# Patient Record
Sex: Female | Born: 2000 | Race: White | Hispanic: No | Smoking: Never smoker
Health system: Southern US, Community
[De-identification: ages and names within clinical notes are randomized; demographics above are authoritative.]

## PROBLEM LIST (undated history)

## (undated) HISTORY — PX: NO PAST SURGERIES: SHX2092

---

## 2020-04-04 ENCOUNTER — Inpatient Hospital Stay (HOSPITAL_COMMUNITY)
Admission: EM | Admit: 2020-04-04 | Discharge: 2020-04-06 | DRG: 519 | Disposition: A | Discharge status: Home / Self Care | Payer: BC Managed Care – PPO | Attending: Orthopedic Surgery | Admitting: Orthopedic Surgery

## 2020-04-04 ENCOUNTER — Emergency Department (HOSPITAL_COMMUNITY): Payer: BC Managed Care – PPO

## 2020-04-04 ENCOUNTER — Other Ambulatory Visit: Payer: Self-pay

## 2020-04-04 ENCOUNTER — Encounter (HOSPITAL_COMMUNITY): Payer: Self-pay

## 2020-04-04 DIAGNOSIS — S329XXA Fracture of unspecified parts of lumbosacral spine and pelvis, initial encounter for closed fracture: Secondary | ICD-10-CM | POA: Diagnosis present

## 2020-04-04 DIAGNOSIS — S32811A Multiple fractures of pelvis with unstable disruption of pelvic ring, initial encounter for closed fracture: Secondary | ICD-10-CM | POA: Diagnosis not present

## 2020-04-04 DIAGNOSIS — Y92410 Unspecified street and highway as the place of occurrence of the external cause: Secondary | ICD-10-CM

## 2020-04-04 DIAGNOSIS — Z23 Encounter for immunization: Secondary | ICD-10-CM

## 2020-04-04 DIAGNOSIS — S32810A Multiple fractures of pelvis with stable disruption of pelvic ring, initial encounter for closed fracture: Secondary | ICD-10-CM | POA: Diagnosis present

## 2020-04-04 DIAGNOSIS — S0181XA Laceration without foreign body of other part of head, initial encounter: Secondary | ICD-10-CM

## 2020-04-04 DIAGNOSIS — Z419 Encounter for procedure for purposes other than remedying health state, unspecified: Secondary | ICD-10-CM

## 2020-04-04 DIAGNOSIS — Z20822 Contact with and (suspected) exposure to covid-19: Secondary | ICD-10-CM | POA: Diagnosis present

## 2020-04-04 DIAGNOSIS — D62 Acute posthemorrhagic anemia: Secondary | ICD-10-CM | POA: Diagnosis present

## 2020-04-04 DIAGNOSIS — S3210XA Unspecified fracture of sacrum, initial encounter for closed fracture: Secondary | ICD-10-CM | POA: Diagnosis present

## 2020-04-04 DIAGNOSIS — S32592A Other specified fracture of left pubis, initial encounter for closed fracture: Secondary | ICD-10-CM

## 2020-04-04 LAB — BASIC METABOLIC PANEL
Anion gap: 9 (ref 5–15)
BUN: 22 mg/dL — ABNORMAL HIGH (ref 6–20)
CO2: 24 mmol/L (ref 22–32)
Calcium: 9.5 mg/dL (ref 8.9–10.3)
Chloride: 104 mmol/L (ref 98–111)
Creatinine, Ser: 0.84 mg/dL (ref 0.44–1.00)
GFR calc Af Amer: >60 mL/min (ref >60)
GFR calc non Af Amer: >60 mL/min (ref >60)
Glucose, Bld: 123 mg/dL — ABNORMAL HIGH (ref 70–99)
Potassium: 3.8 mmol/L (ref 3.5–5.1)
Sodium: 137 mmol/L (ref 135–145)

## 2020-04-04 LAB — I-STAT CHEM 8, ED
BUN: 25 mg/dL — ABNORMAL HIGH (ref 6–20)
Calcium, Ion: 1.28 mmol/L (ref 1.15–1.40)
Chloride: 103 mmol/L (ref 98–111)
Creatinine, Ser: 0.8 mg/dL (ref 0.44–1.00)
Glucose, Bld: 119 mg/dL — ABNORMAL HIGH (ref 70–99)
HCT: 38 % (ref 36.0–46.0)
Hemoglobin: 12.9 g/dL (ref 12.0–15.0)
Potassium: 3.7 mmol/L (ref 3.5–5.1)
Sodium: 140 mmol/L (ref 135–145)
TCO2: 27 mmol/L (ref 22–32)

## 2020-04-04 LAB — CBC
HCT: 38.6 % (ref 36.0–46.0)
Hemoglobin: 12.8 g/dL (ref 12.0–15.0)
MCH: 28.7 pg (ref 26.0–34.0)
MCHC: 33.2 g/dL (ref 30.0–36.0)
MCV: 86.5 fL (ref 80.0–100.0)
Platelets: 299 10*3/uL (ref 150–400)
RBC: 4.46 MIL/uL (ref 3.87–5.11)
RDW: 12.3 % (ref 11.5–15.5)
WBC: 16.6 10*3/uL — ABNORMAL HIGH (ref 4.0–10.5)
nRBC: 0 % (ref 0.0–0.2)

## 2020-04-04 LAB — I-STAT BETA HCG BLOOD, ED (MC, WL, AP ONLY): I-stat hCG, quantitative: <5 m[IU]/mL (ref <5)

## 2020-04-04 MED ORDER — LIDOCAINE HCL (PF) 1 % IJ SOLN
30.0000 mL | Freq: Once | INTRAMUSCULAR | Status: AC
  Administered 2020-04-04: 30 mL
  Filled 2020-04-04: qty 30

## 2020-04-04 MED ORDER — FENTANYL CITRATE (PF) 100 MCG/2ML IJ SOLN
50.0000 ug | Freq: Once | INTRAMUSCULAR | Status: AC
  Administered 2020-04-04: 50 ug via INTRAVENOUS
  Filled 2020-04-04: qty 2

## 2020-04-04 MED ORDER — LIDOCAINE HCL 2 % IJ SOLN
5.0000 mL | Freq: Once | INTRAMUSCULAR | Status: AC
  Administered 2020-04-05: 100 mg
  Filled 2020-04-04: qty 20

## 2020-04-04 MED ORDER — IOHEXOL 300 MG/ML  SOLN
100.0000 mL | Freq: Once | INTRAMUSCULAR | Status: AC | PRN
  Administered 2020-04-04: 100 mL via INTRAVENOUS

## 2020-04-04 MED ORDER — TETANUS-DIPHTH-ACELL PERTUSSIS 5-2.5-18.5 LF-MCG/0.5 IM SUSP
0.5000 mL | Freq: Once | INTRAMUSCULAR | Status: AC
  Administered 2020-04-04: 0.5 mL via INTRAMUSCULAR
  Filled 2020-04-04: qty 0.5
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
## 2020-04-04 NOTE — Discharge Instructions (Addendum)
Orthopaedic Trauma Service Discharge Instructions   General Discharge Instructions  Orthopaedic Injuries:  Pelvic ring fracture treated with sacroiliac screws  WEIGHT BEARING STATUS: Touchdown weightbearing left leg, weight-bear as tolerated right leg.  Use walker or crutches  RANGE OF MOTION/ACTIVITY: Unrestricted range of motion hips and knees.  Activity as tolerated while maintaining weightbearing restrictions above  Wound Care: Daily wound care starting on 04/09/2020.  See below   Discharge Wound Care Instructions  Do NOT apply any ointments, solutions or lotions to pin sites or surgical wounds.  These prevent needed drainage and even though solutions like hydrogen peroxide kill bacteria, they also damage cells lining the pin sites that help fight infection.  Applying lotions or ointments can keep the wounds moist and can cause them to breakdown and open up as well. This can increase the risk for infection. When in doubt call the office.  Surgical incisions should be dressed daily Starting on 04/09/2020  If any drainage is noted, use gauze and tape  Once the incision is completely dry and without drainage, it may be left open to air out.  Showering may begin 36-48 hours later.  Cleaning gently with soap and water.  DVT/PE prophylaxis: Eliquis 2.5 mg every 12 hours x 4 weeks  Diet: as you were eating previously.  Can use over the counter stool softeners and bowel preparations, such as Miralax, to help with bowel movements.  Narcotics can be constipating.  Be sure to drink plenty of fluids  PAIN MEDICATION USE AND EXPECTATIONS  You have likely been given narcotic medications to help control your pain.  After a traumatic event that results in an fracture (broken bone) with or without surgery, it is ok to use narcotic pain medications to help control one's pain.  We understand that everyone responds to pain differently and each individual patient will be evaluated on a regular basis  for the continued need for narcotic medications. Ideally, narcotic medication use should last no more than 6-8 weeks (coinciding with fracture healing).   As a patient it is your responsibility as well to monitor narcotic medication use and report the amount and frequency you use these medications when you come to your office visit.   We would also advise that if you are using narcotic medications, you should take a dose prior to therapy to maximize you participation.  IF YOU ARE ON NARCOTIC MEDICATIONS IT IS NOT PERMISSIBLE TO OPERATE A MOTOR VEHICLE (MOTORCYCLE/CAR/TRUCK/MOPED) OR HEAVY MACHINERY DO NOT MIX NARCOTICS WITH OTHER CNS (CENTRAL NERVOUS SYSTEM) DEPRESSANTS SUCH AS ALCOHOL   STOP SMOKING OR USING NICOTINE PRODUCTS!!!!  As discussed nicotine severely impairs your body's ability to heal surgical and traumatic wounds but also impairs bone healing.  Wounds and bone heal by forming microscopic blood vessels (angiogenesis) and nicotine is a vasoconstrictor (essentially, shrinks blood vessels).  Therefore, if vasoconstriction occurs to these microscopic blood vessels they essentially disappear and are unable to deliver necessary nutrients to the healing tissue.  This is one modifiable factor that you can do to dramatically increase your chances of healing your injury.    (This means no smoking, no nicotine gum, patches, etc)  DO NOT USE NONSTEROIDAL ANTI-INFLAMMATORY DRUGS (NSAID'S)  Using products such as Advil (ibuprofen), Aleve (naproxen), Motrin (ibuprofen) for additional pain control during fracture healing can delay and/or prevent the healing response.  If you would like to take over the counter (OTC) medication, Tylenol (acetaminophen) is ok.  However, some narcotic medications that are given for  pain control contain acetaminophen as well. Therefore, you should not exceed more than 4000 mg of tylenol in a day if you do not have liver disease.  Also note that there are may OTC medicines,  such as cold medicines and allergy medicines that my contain tylenol as well.  If you have any questions about medications and/or interactions please ask your doctor/PA or your pharmacist.      ICE AND ELEVATE INJURED/OPERATIVE EXTREMITY  Using ice and elevating the injured extremity above your heart can help with swelling and pain control.  Icing in a pulsatile fashion, such as 20 minutes on and 20 minutes off, can be followed.    Do not place ice directly on skin. Make sure there is a barrier between to skin and the ice pack.    Using frozen items such as frozen peas works well as the conform nicely to the are that needs to be iced.  USE AN ACE WRAP OR TED HOSE FOR SWELLING CONTROL  In addition to icing and elevation, Ace wraps or TED hose are used to help limit and resolve swelling.  It is recommended to use Ace wraps or TED hose until you are informed to stop.    When using Ace Wraps start the wrapping distally (farthest away from the body) and wrap proximally (closer to the body)   Example: If you had surgery on your leg or thing and you do not have a splint on, start the ace wrap at the toes and work your way up to the thigh        If you had surgery on your upper extremity and do not have a splint on, start the ace wrap at your fingers and work your way up to the upper arm  IF YOU ARE IN A SPLINT OR CAST DO NOT REMOVE IT FOR ANY REASON   If your splint gets wet for any reason please contact the office immediately. You may shower in your splint or cast as long as you keep it dry.  This can be done by wrapping in a cast cover or garbage back (or similar)  Do Not stick any thing down your splint or cast such as pencils, money, or hangers to try and scratch yourself with.  If you feel itchy take benadryl as prescribed on the bottle for itching  IF YOU ARE IN A CAM BOOT (BLACK BOOT)  You may remove boot periodically. Perform daily dressing changes as noted below.  Wash the liner of the boot  regularly and wear a sock when wearing the boot. It is recommended that you sleep in the boot until told otherwise    Call office for the following:  Temperature greater than 101F  Persistent nausea and vomiting  Severe uncontrolled pain  Redness, tenderness, or signs of infection (pain, swelling, redness, odor or green/yellow discharge around the site)  Difficulty breathing, headache or visual disturbances  Hives  Persistent dizziness or light-headedness  Extreme fatigue  Any other questions or concerns you may have after discharge  In an emergency, call 911 or go to an Emergency Department at a nearby hospital  HELPFUL INFORMATION  ? If you had a block, it will wear off between 8-24 hrs postop typically.  This is period when your pain may go from nearly zero to the pain you would have had postop without the block.  This is an abrupt transition but nothing dangerous is happening.  You may take an extra dose of narcotic  when this happens.  ? You should wean off your narcotic medicines as soon as you are able.  Most patients will be off or using minimal narcotics before their first postop appointment.   ? We suggest you use the pain medication the first night prior to going to bed, in order to ease any pain when the anesthesia wears off. You should avoid taking pain medications on an empty stomach as it will make you nauseous.  ? Do not drink alcoholic beverages or take illicit drugs when taking pain medications.  ? In most states it is against the law to drive while you are in a splint or sling.  And certainly against the law to drive while taking narcotics.  ? You may return to work/school in the next couple of days when you feel up to it.   ? Pain medication may make you constipated.  Below are a few solutions to try in this order: - Decrease the amount of pain medication if you aren't having pain. - Drink lots of decaffeinated fluids. - Drink prune juice and/or each  dried prunes  o If the first 3 don't work start with additional solutions - Take Colace - an over-the-counter stool softener - Take Senokot - an over-the-counter laxative - Take Miralax - a stronger over-the-counter laxative     CALL THE OFFICE WITH ANY QUESTIONS OR CONCERNS: [phone]   VISIT OUR WEBSITE FOR ADDITIONAL INFORMATION: [email or web address]

## 2020-04-04 NOTE — ED Notes (Signed)
Pt's mother came to nurses station and said the pt's pain is getting really bad. Explained to the pt's mother that the provider is aware and that she will be with them as soon as she is able, but that she is in a room right now. Pt's mother verbalized understanding.

## 2020-04-04 NOTE — ED Notes (Signed)
Irrigated wounds and cleaned pt up

## 2020-04-04 NOTE — ED Notes (Signed)
Pt to CT via stretcher
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
## 2020-04-05 ENCOUNTER — Inpatient Hospital Stay (HOSPITAL_COMMUNITY): Payer: BC Managed Care – PPO | Admitting: Certified Registered Nurse Anesthetist

## 2020-04-05 ENCOUNTER — Encounter (HOSPITAL_COMMUNITY)
Admission: EM | Disposition: A | Discharge status: Home / Self Care | Payer: Self-pay | Source: Home / Self Care | Attending: Orthopedic Surgery

## 2020-04-05 ENCOUNTER — Inpatient Hospital Stay (HOSPITAL_COMMUNITY): Payer: BC Managed Care – PPO

## 2020-04-05 ENCOUNTER — Encounter (HOSPITAL_COMMUNITY): Payer: Self-pay | Admitting: Orthopedic Surgery

## 2020-04-05 DIAGNOSIS — Z23 Encounter for immunization: Secondary | ICD-10-CM | POA: Diagnosis not present

## 2020-04-05 DIAGNOSIS — S3210XA Unspecified fracture of sacrum, initial encounter for closed fracture: Secondary | ICD-10-CM | POA: Diagnosis present

## 2020-04-05 DIAGNOSIS — Y92410 Unspecified street and highway as the place of occurrence of the external cause: Secondary | ICD-10-CM | POA: Diagnosis not present

## 2020-04-05 DIAGNOSIS — S0181XA Laceration without foreign body of other part of head, initial encounter: Secondary | ICD-10-CM | POA: Diagnosis present

## 2020-04-05 DIAGNOSIS — S329XXA Fracture of unspecified parts of lumbosacral spine and pelvis, initial encounter for closed fracture: Secondary | ICD-10-CM | POA: Diagnosis present

## 2020-04-05 DIAGNOSIS — Z20822 Contact with and (suspected) exposure to covid-19: Secondary | ICD-10-CM | POA: Diagnosis present

## 2020-04-05 DIAGNOSIS — D62 Acute posthemorrhagic anemia: Secondary | ICD-10-CM | POA: Diagnosis present

## 2020-04-05 DIAGNOSIS — S32811A Multiple fractures of pelvis with unstable disruption of pelvic ring, initial encounter for closed fracture: Secondary | ICD-10-CM | POA: Diagnosis present

## 2020-04-05 DIAGNOSIS — S32810A Multiple fractures of pelvis with stable disruption of pelvic ring, initial encounter for closed fracture: Secondary | ICD-10-CM | POA: Diagnosis present

## 2020-04-05 HISTORY — PX: SACRO-ILIAC PINNING: SHX5050

## 2020-04-05 LAB — CBC
HCT: 35.6 % — ABNORMAL LOW (ref 36.0–46.0)
Hemoglobin: 11.6 g/dL — ABNORMAL LOW (ref 12.0–15.0)
MCH: 28.1 pg (ref 26.0–34.0)
MCHC: 32.6 g/dL (ref 30.0–36.0)
MCV: 86.2 fL (ref 80.0–100.0)
Platelets: 210 10*3/uL (ref 150–400)
RBC: 4.13 MIL/uL (ref 3.87–5.11)
RDW: 12.4 % (ref 11.5–15.5)
WBC: 11.3 10*3/uL — ABNORMAL HIGH (ref 4.0–10.5)
nRBC: 0 % (ref 0.0–0.2)

## 2020-04-05 LAB — CREATININE, SERUM
Creatinine, Ser: 0.74 mg/dL (ref 0.44–1.00)
GFR calc non Af Amer: >60 mL/min (ref >60)

## 2020-04-05 LAB — RESPIRATORY PANEL BY RT PCR (FLU A&B, COVID)
Influenza A by PCR: NEGATIVE
Influenza B by PCR: NEGATIVE
SARS Coronavirus 2 by RT PCR: NEGATIVE

## 2020-04-05 SURGERY — PINNING, SACROILIAC JOINT, PERCUTANEOUS
Anesthesia: General | Laterality: Left

## 2020-04-05 MED ORDER — PROPOFOL 10 MG/ML IV BOLUS
INTRAVENOUS | Status: DC | PRN
  Administered 2020-04-05: 140 mg via INTRAVENOUS

## 2020-04-05 MED ORDER — MORPHINE SULFATE (PF) 2 MG/ML IV SOLN: 0.5000 mg | INTRAVENOUS | Status: DC | PRN

## 2020-04-05 MED ORDER — METOCLOPRAMIDE HCL 5 MG/ML IJ SOLN: 5.0000 mg | Freq: Three times a day (TID) | INTRAMUSCULAR | Status: DC | PRN

## 2020-04-05 MED ORDER — HYDROCODONE-ACETAMINOPHEN 5-325 MG PO TABS
1.0000 | ORAL_TABLET | Freq: Four times a day (QID) | ORAL | Status: DC | PRN
  Administered 2020-04-05: 1 via ORAL
  Filled 2020-04-05: qty 1

## 2020-04-05 MED ORDER — LIDOCAINE 2% (20 MG/ML) 5 ML SYRINGE
INTRAMUSCULAR | Status: AC
  Filled 2020-04-05: qty 5

## 2020-04-05 MED ORDER — ONDANSETRON HCL 4 MG PO TABS: 4.0000 mg | ORAL_TABLET | Freq: Four times a day (QID) | ORAL | Status: DC | PRN

## 2020-04-05 MED ORDER — ONDANSETRON HCL 4 MG/2ML IJ SOLN
INTRAMUSCULAR | Status: AC
  Filled 2020-04-05: qty 2

## 2020-04-05 MED ORDER — CEFAZOLIN SODIUM-DEXTROSE 1-4 GM/50ML-% IV SOLN
1.0000 g | Freq: Four times a day (QID) | INTRAVENOUS | Status: AC
  Administered 2020-04-05 – 2020-04-06 (×3): 1 g via INTRAVENOUS
  Filled 2020-04-05 (×3): qty 50

## 2020-04-05 MED ORDER — AMISULPRIDE (ANTIEMETIC) 5 MG/2ML IV SOLN: 10.0000 mg | Freq: Once | INTRAVENOUS | Status: DC | PRN

## 2020-04-05 MED ORDER — KETOROLAC TROMETHAMINE 15 MG/ML IJ SOLN
15.0000 mg | Freq: Four times a day (QID) | INTRAMUSCULAR | Status: AC
  Administered 2020-04-05 – 2020-04-06 (×4): 15 mg via INTRAVENOUS
  Filled 2020-04-05 (×4): qty 1

## 2020-04-05 MED ORDER — MEPERIDINE HCL 25 MG/ML IJ SOLN: 6.2500 mg | INTRAMUSCULAR | Status: DC | PRN

## 2020-04-05 MED ORDER — SUGAMMADEX SODIUM 200 MG/2ML IV SOLN
INTRAVENOUS | Status: DC | PRN
  Administered 2020-04-05: 300 mg via INTRAVENOUS

## 2020-04-05 MED ORDER — ACETAMINOPHEN 10 MG/ML IV SOLN: 1000.0000 mg | Freq: Once | INTRAVENOUS | Status: DC | PRN

## 2020-04-05 MED ORDER — CHLORHEXIDINE GLUCONATE 0.12 % MT SOLN: 15.0000 mL | Freq: Once | OROMUCOSAL | Status: DC

## 2020-04-05 MED ORDER — METOCLOPRAMIDE HCL 5 MG PO TABS: 5.0000 mg | ORAL_TABLET | Freq: Three times a day (TID) | ORAL | Status: DC | PRN

## 2020-04-05 MED ORDER — SODIUM CHLORIDE 0.9 % IV SOLN: INTRAVENOUS | Status: AC

## 2020-04-05 MED ORDER — LIDOCAINE 2% (20 MG/ML) 5 ML SYRINGE
INTRAMUSCULAR | Status: DC | PRN
  Administered 2020-04-05: 40 mg via INTRAVENOUS

## 2020-04-05 MED ORDER — HYDROMORPHONE HCL 1 MG/ML IJ SOLN
0.5000 mg | INTRAMUSCULAR | Status: DC | PRN
  Administered 2020-04-05 (×2): 0.5 mg via INTRAVENOUS
  Administered 2020-04-05: 1 mg via INTRAVENOUS
  Filled 2020-04-05: qty 1
  Filled 2020-04-05: qty 0.5
  Filled 2020-04-05: qty 1

## 2020-04-05 MED ORDER — ROCURONIUM BROMIDE 10 MG/ML (PF) SYRINGE
PREFILLED_SYRINGE | INTRAVENOUS | Status: AC
  Filled 2020-04-05: qty 10

## 2020-04-05 MED ORDER — MAGNESIUM HYDROXIDE 400 MG/5ML PO SUSP: 30.0000 mL | Freq: Every day | ORAL | Status: DC | PRN

## 2020-04-05 MED ORDER — DEXAMETHASONE SODIUM PHOSPHATE 10 MG/ML IJ SOLN
INTRAMUSCULAR | Status: DC | PRN
  Administered 2020-04-05: 10 mg via INTRAVENOUS

## 2020-04-05 MED ORDER — ACETAMINOPHEN 325 MG PO TABS: 325.0000 mg | ORAL_TABLET | Freq: Once | ORAL | Status: DC | PRN

## 2020-04-05 MED ORDER — METHOCARBAMOL 500 MG PO TABS
750.0000 mg | ORAL_TABLET | Freq: Three times a day (TID) | ORAL | Status: DC
  Administered 2020-04-05 – 2020-04-06 (×2): 750 mg via ORAL
  Filled 2020-04-05 (×2): qty 2

## 2020-04-05 MED ORDER — METHOCARBAMOL 1000 MG/10ML IJ SOLN
500.0000 mg | Freq: Three times a day (TID) | INTRAVENOUS | Status: DC
  Filled 2020-04-05 (×4): qty 5

## 2020-04-05 MED ORDER — LACTATED RINGERS IV SOLN: INTRAVENOUS | Status: DC

## 2020-04-05 MED ORDER — ONDANSETRON HCL 4 MG/2ML IJ SOLN
INTRAMUSCULAR | Status: DC | PRN
  Administered 2020-04-05: 4 mg via INTRAVENOUS

## 2020-04-05 MED ORDER — FENTANYL CITRATE (PF) 250 MCG/5ML IJ SOLN
INTRAMUSCULAR | Status: DC | PRN
Start: 2020-04-05 — End: 2020-04-05
  Administered 2020-04-05: 50 ug via INTRAVENOUS
  Administered 2020-04-05: 75 ug via INTRAVENOUS
  Administered 2020-04-05: 100 ug via INTRAVENOUS
  Administered 2020-04-05: 25 ug via INTRAVENOUS

## 2020-04-05 MED ORDER — HYDROMORPHONE HCL 1 MG/ML IJ SOLN: 0.2500 mg | INTRAMUSCULAR | Status: DC | PRN

## 2020-04-05 MED ORDER — HYDROCODONE-ACETAMINOPHEN 5-325 MG PO TABS: 1.0000 | ORAL_TABLET | ORAL | Status: DC | PRN

## 2020-04-05 MED ORDER — ACETAMINOPHEN 160 MG/5ML PO SOLN: 325.0000 mg | Freq: Once | ORAL | Status: DC | PRN

## 2020-04-05 MED ORDER — FENTANYL CITRATE (PF) 250 MCG/5ML IJ SOLN
INTRAMUSCULAR | Status: AC
  Filled 2020-04-05: qty 5

## 2020-04-05 MED ORDER — SCOPOLAMINE 1 MG/3DAYS TD PT72
MEDICATED_PATCH | TRANSDERMAL | Status: DC | PRN
  Administered 2020-04-05: 1 via TRANSDERMAL

## 2020-04-05 MED ORDER — ACETAMINOPHEN 325 MG PO TABS: 325.0000 mg | ORAL_TABLET | Freq: Four times a day (QID) | ORAL | Status: DC | PRN

## 2020-04-05 MED ORDER — DEXMEDETOMIDINE (PRECEDEX) IN NS 20 MCG/5ML (4 MCG/ML) IV SYRINGE
PREFILLED_SYRINGE | INTRAVENOUS | Status: AC
  Filled 2020-04-05: qty 5

## 2020-04-05 MED ORDER — CEFAZOLIN SODIUM-DEXTROSE 2-3 GM-%(50ML) IV SOLR
INTRAVENOUS | Status: DC | PRN
  Administered 2020-04-05: 2 g via INTRAVENOUS

## 2020-04-05 MED ORDER — 0.9 % SODIUM CHLORIDE (POUR BTL) OPTIME
TOPICAL | Status: DC | PRN
  Administered 2020-04-05: 1000 mL

## 2020-04-05 MED ORDER — MAGNESIUM CITRATE PO SOLN: 1.0000 | Freq: Once | ORAL | Status: DC | PRN

## 2020-04-05 MED ORDER — MIDAZOLAM HCL 2 MG/2ML IJ SOLN
INTRAMUSCULAR | Status: DC | PRN
  Administered 2020-04-05: 2 mg via INTRAVENOUS

## 2020-04-05 MED ORDER — DEXMEDETOMIDINE (PRECEDEX) IN NS 20 MCG/5ML (4 MCG/ML) IV SYRINGE
PREFILLED_SYRINGE | INTRAVENOUS | Status: DC | PRN
  Administered 2020-04-05: 12 ug via INTRAVENOUS
  Administered 2020-04-05: 8 ug via INTRAVENOUS

## 2020-04-05 MED ORDER — ACETAMINOPHEN 500 MG PO TABS
500.0000 mg | ORAL_TABLET | Freq: Three times a day (TID) | ORAL | Status: DC
  Administered 2020-04-05 – 2020-04-06 (×3): 500 mg via ORAL
  Filled 2020-04-05 (×3): qty 1

## 2020-04-05 MED ORDER — POTASSIUM CHLORIDE IN NACL 20-0.9 MEQ/L-% IV SOLN
INTRAVENOUS | Status: DC
  Filled 2020-04-05: qty 1000

## 2020-04-05 MED ORDER — SUCCINYLCHOLINE CHLORIDE 200 MG/10ML IV SOSY
PREFILLED_SYRINGE | INTRAVENOUS | Status: AC
  Filled 2020-04-05: qty 10

## 2020-04-05 MED ORDER — DEXAMETHASONE SODIUM PHOSPHATE 10 MG/ML IJ SOLN
INTRAMUSCULAR | Status: AC
  Filled 2020-04-05: qty 1

## 2020-04-05 MED ORDER — DOCUSATE SODIUM 100 MG PO CAPS
100.0000 mg | ORAL_CAPSULE | Freq: Two times a day (BID) | ORAL | Status: DC
  Administered 2020-04-05 – 2020-04-06 (×2): 100 mg via ORAL
  Filled 2020-04-05 (×2): qty 1

## 2020-04-05 MED ORDER — HYDROCODONE-ACETAMINOPHEN 7.5-325 MG PO TABS: 1.0000 | ORAL_TABLET | ORAL | Status: DC | PRN

## 2020-04-05 MED ORDER — CHLORHEXIDINE GLUCONATE 0.12 % MT SOLN
OROMUCOSAL | Status: AC
  Filled 2020-04-05: qty 15

## 2020-04-05 MED ORDER — ONDANSETRON HCL 4 MG/2ML IJ SOLN: 4.0000 mg | Freq: Four times a day (QID) | INTRAMUSCULAR | Status: DC | PRN

## 2020-04-05 MED ORDER — CEFAZOLIN SODIUM-DEXTROSE 2-4 GM/100ML-% IV SOLN
INTRAVENOUS | Status: AC
  Filled 2020-04-05: qty 100

## 2020-04-05 MED ORDER — ROCURONIUM BROMIDE 10 MG/ML (PF) SYRINGE
PREFILLED_SYRINGE | INTRAVENOUS | Status: DC | PRN
  Administered 2020-04-05: 60 mg via INTRAVENOUS
  Administered 2020-04-05: 20 mg via INTRAVENOUS

## 2020-04-05 MED ORDER — MIDAZOLAM HCL 2 MG/2ML IJ SOLN
INTRAMUSCULAR | Status: AC
  Filled 2020-04-05: qty 2

## 2020-04-05 MED ORDER — ENOXAPARIN SODIUM 40 MG/0.4ML ~~LOC~~ SOLN
40.0000 mg | SUBCUTANEOUS | Status: DC
  Administered 2020-04-06: 40 mg via SUBCUTANEOUS
  Filled 2020-04-05: qty 0.4

## 2020-04-05 MED ORDER — ORAL CARE MOUTH RINSE: 15.0000 mL | Freq: Once | OROMUCOSAL | Status: DC

## 2020-04-05 MED ORDER — SORBITOL 70 % SOLN
30.0000 mL | Freq: Every day | Status: DC | PRN
  Administered 2020-04-06: 30 mL via ORAL
  Filled 2020-04-05: qty 30

## 2020-04-05 MED ORDER — PHENYLEPHRINE 40 MCG/ML (10ML) SYRINGE FOR IV PUSH (FOR BLOOD PRESSURE SUPPORT)
PREFILLED_SYRINGE | INTRAVENOUS | Status: DC | PRN
  Administered 2020-04-05: 40 ug via INTRAVENOUS
  Administered 2020-04-05: 80 ug via INTRAVENOUS

## 2020-04-05 SURGICAL SUPPLY — 38 items
BIT DRILL 4.8X200 CANN (BIT) ×3 IMPLANT
BRUSH SCRUB EZ PLAIN DRY (MISCELLANEOUS) ×6 IMPLANT
COVER SURGICAL LIGHT HANDLE (MISCELLANEOUS) ×3 IMPLANT
DRAPE C-ARM 42X72 X-RAY (DRAPES) ×3 IMPLANT
DRAPE C-ARMOR (DRAPES) ×3 IMPLANT
DRAPE INCISE IOBAN 66X45 STRL (DRAPES) ×3 IMPLANT
DRAPE LAPAROTOMY TRNSV 102X78 (DRAPES) ×3 IMPLANT
DRAPE U-SHAPE 47X51 STRL (DRAPES) ×3 IMPLANT
DRSG MEPILEX BORDER 4X4 (GAUZE/BANDAGES/DRESSINGS) ×3 IMPLANT
ELECT REM PT RETURN 9FT ADLT (ELECTROSURGICAL) ×3
ELECTRODE REM PT RTRN 9FT ADLT (ELECTROSURGICAL) ×1 IMPLANT
GLOVE BIO SURGEON STRL SZ7.5 (GLOVE) ×3 IMPLANT
GLOVE BIO SURGEON STRL SZ8 (GLOVE) ×3 IMPLANT
GLOVE BIOGEL PI IND STRL 7.5 (GLOVE) ×1 IMPLANT
GLOVE BIOGEL PI IND STRL 8 (GLOVE) ×1 IMPLANT
GLOVE BIOGEL PI INDICATOR 7.5 (GLOVE) ×2
GLOVE BIOGEL PI INDICATOR 8 (GLOVE) ×2
GOWN STRL REUS W/ TWL LRG LVL3 (GOWN DISPOSABLE) ×2 IMPLANT
GOWN STRL REUS W/ TWL XL LVL3 (GOWN DISPOSABLE) ×1 IMPLANT
GOWN STRL REUS W/TWL LRG LVL3 (GOWN DISPOSABLE) ×4
GOWN STRL REUS W/TWL XL LVL3 (GOWN DISPOSABLE) ×2
GUIDE PIN DRILL TIP 2.8X450HIP (PIN) ×3
KIT BASIN OR (CUSTOM PROCEDURE TRAY) ×3 IMPLANT
KIT TURNOVER KIT B (KITS) ×3 IMPLANT
MANIFOLD NEPTUNE II (INSTRUMENTS) ×3 IMPLANT
NS IRRIG 1000ML POUR BTL (IV SOLUTION) ×3 IMPLANT
PACK GENERAL/GYN (CUSTOM PROCEDURE TRAY) ×3 IMPLANT
PAD ARMBOARD 7.5X6 YLW CONV (MISCELLANEOUS) ×6 IMPLANT
PIN GUIDE DRILL TIP 2.8X300 (DRILL) ×9 IMPLANT
PIN GUIDE DRILL TIP 2.8X450HIP (PIN) ×1 IMPLANT
SCREW CANN 8X135X40 (Screw) ×3 IMPLANT
SCREW CANN 8X160X40 (Screw) ×3 IMPLANT
TOWEL GREEN STERILE (TOWEL DISPOSABLE) ×6 IMPLANT
TOWEL GREEN STERILE FF (TOWEL DISPOSABLE) ×3 IMPLANT
UNDERPAD 30X36 HEAVY ABSORB (UNDERPADS AND DIAPERS) ×3 IMPLANT
WASHER 8.0 (Orthopedic Implant) ×4 IMPLANT
WASHER CANN FLAT 8 (Orthopedic Implant) ×2 IMPLANT
WATER STERILE IRR 1000ML POUR (IV SOLUTION) ×3 IMPLANT

## 2020-04-05 NOTE — Progress Notes (Signed)
Pt arrived to unit from ED. Pt alert/oriented in no acute distress. Pt orientated/situated to room/equipments.Menu/welcome pack provided to pt/family. Pt/family have been informed of facility's valuables policy. No complaints voiced.bed in lowest position with 3 side rails up, call bell/room phone within reach and all wheels locked.

## 2020-04-05 NOTE — Anesthesia Preprocedure Evaluation (Signed)
Anesthesia Evaluation  Patient identified by MRN, date of birth, ID band Patient awake    Reviewed: Allergy & Precautions, NPO status , Patient's Chart, lab work & pertinent test results  Airway Mallampati: II  TM Distance: >3 FB Neck ROM: Full    Dental  (+) Teeth Intact, Dental Advisory Given   Pulmonary neg pulmonary ROS,    breath sounds clear to auscultation       Cardiovascular negative cardio ROS   Rhythm:Regular Rate:Normal     Neuro/Psych negative neurological ROS  negative psych ROS   GI/Hepatic negative GI ROS, Neg liver ROS,   Endo/Other  negative endocrine ROS  Renal/GU negative Renal ROS     Musculoskeletal negative musculoskeletal ROS (+)   Abdominal Normal abdominal exam  (+)   Peds  Hematology negative hematology ROS (+)   Anesthesia Other Findings   Reproductive/Obstetrics                            Anesthesia Physical Anesthesia Plan  ASA: II  Anesthesia Plan: General   Post-op Pain Management:    Induction: Intravenous  PONV Risk Score and Plan: 4 or greater and Ondansetron, Dexamethasone, Midazolam and Scopolamine patch - Pre-op  Airway Management Planned: Oral ETT  Additional Equipment: None  Intra-op Plan:   Post-operative Plan: Extubation in OR  Informed Consent: I have reviewed the patients History and Physical, chart, labs and discussed the procedure including the risks, benefits and alternatives for the proposed anesthesia with the patient or authorized representative who has indicated his/her understanding and acceptance.     Dental advisory given  Plan Discussed with: CRNA  Anesthesia Plan Comments:        Anesthesia Quick Evaluation  

## 2020-04-05 NOTE — Op Note (Signed)
04/05/2020 6:31 PM  PATIENT:  Charlann Noss  PRE-OPERATIVE DIAGNOSIS:  UNSTABLE PELVIC RING FRACTURE  POST-OPERATIVE DIAGNOSIS:  UNSTABLE PELVIC RING FRACTURE  PROCEDURE:  Procedure(s): PERCUTANEOUS SACRO-ILIAC SCREW FIXATION, LEFT AND RIGHT (BILATERAL), OF THE POSTERIOR PELVIC RING  SURGEON:  Surgeon(s) and Role:    Myrene Galas, MD - Primary  ASSISTANTS: none   ANESTHESIA:   general  EBL:  30 mL   BLOOD ADMINISTERED:none  DRAINS: none   LOCAL MEDICATIONS USED:  NONE  SPECIMEN:  No Specimen  DISPOSITION OF SPECIMEN:  N/A  COUNTS:  YES  TOURNIQUET:  * No tourniquets in log *  DICTATION: .Note written in EPIC  PLAN OF CARE: Admit to inpatient   PATIENT DISPOSITION:  PACU - hemodynamically stable.   Delay start of Pharmacological VTE agent (>24hrs) due to surgical blood loss or risk of bleeding: no  BRIEF SUMMARY AND INDICATIONS FOR PROCEDURE:  Patient sustained an unstable pelvic ring injury requiring admission to, and treatment by, the orthopaedic trauma service. Consequently, I was consulted to assume management.  We discussed with the risks and benefits of the surgery with patient and her mother including nonunion,, malunion, arthritis, loss of fixation, sexual dysfunction, pain, nerve injury, vessel injury, DVT, PE, and others.  We also specifically discussed urologic injury and footdrop. After full discussion, consent obtained and decision made to proceed.   BRIEF SUMMARY OF PROCEDURE:  The patient was taken to the operating room, where general anesthesia was induced.  The abdomen and flank were prepped and draped in the usual sterile fashion. Attention was turned first to the left SI joint.  A true lateral view of S1 vertebral body was used to obtain the correct starting point and trajectory. The guide pin was advanced through the iliac bone, across the left SI joint and into the S1 vetebral body. Before advancing the pin we checked its position on the inlet  to make sure that it was posterior to the ala and anterior to the canal. We checked the outlet to make sure that it was superior to the S1 foramina and between the vertebral discs.  The guide pin was advanced. We then turned our attention to the right side of the posterior pelvis and further advanced the wire across the right ala, the SI joint, and to the outer cortex of the right ilium.  On this side we also checked  its position on the inlet to make sure that it was posterior to the ala and anterior to the canal, and the outlet to confirm superior to the S1 foramina and below the ala. The pin was measured for length, overdrilled, and then the screw with washer inserted. During both pin and screw placement my assistant manipulated the pelvis to assist reduction.  Final images showed appropriate placement and length across both sides of the pelvis.   An additional S2 screw was placed in similar manner, using the lateral for starting pint and trajectory. The pin was driven through the ilium, across one SI joint into the S2 vertebral body, and then driven across the opposite sacral body, SI joint, and ilium. We again used inlet and outlet views to carefully assume position in addition to final lateral view, as well. The screw with washer was then inserted after measuring and drilling. Excellent purchase was obtained and the screw was again carefully checked for length and trajectory using inlet, outlet, and lateral views.  Wounds were irrigated thoroughly and closed in standard fashion with nylon. Sterile dressings were  applied.   Please note that Biomet 8.0 mm screws with washers were used, advanced with star driver, measuring 734 mm and 135 mm. They were advanced to full apposition with bone but not excessively compressed which could have injured her nerve roots within the comminuted foramina.   PROGNOSIS:  With regard to mobilization, the patient will be TDWB on LLE and WBAT on the RLE.   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 Patient will remain on the Orthopaedic Trauma Service and may start pharmacologic DVT prophylaxis.  Discharge tomorrow or following day.       Doralee Albino. Carola Frost, M.D.

## 2020-04-05 NOTE — ED Provider Notes (Signed)
  Physical Exam  BP (!) 100/58 (BP Location: Right Arm)   Pulse 94   Temp 98.7 F (37.1 C)   Resp 18   Ht 5\' 6"  (1.676 m)   Wt 65.8 kg   SpO2 99%   BMI 23.40 kg/m   Physical Exam  ED Course/Procedures     . Laceration Repair  Date/Time: 04/05/2020 12:13 AM Performed by: 06/05/2020, MD Authorized by: Benjiman Core, MD   Consent:    Consent obtained:  Verbal   Consent given by:  Patient and parent   Risks discussed:  Infection, pain, retained foreign body, poor cosmetic result, need for additional repair, nerve damage and poor wound healing   Alternatives discussed:  Delayed treatment, observation and no treatment Anesthesia (see MAR for exact dosages):    Anesthesia method:  Local infiltration   Local anesthetic:  Lidocaine 2% w/o epi Laceration details:    Location:  Face   Face location:  L cheek   Length (cm):  3 Repair type:    Repair type:  Simple Pre-procedure details:    Preparation:  Patient was prepped and draped in usual sterile fashion and imaging obtained to evaluate for foreign bodies Exploration:    Hemostasis achieved with:  Direct pressure   Wound exploration: entire depth of wound probed and visualized     Contaminated: no   Treatment:    Area cleansed with:  Saline   Amount of cleaning:  Standard Skin repair:    Repair method:  Sutures   Suture size:  6-0   Suture material:  Prolene   Suture technique: 8 running stitches placed on 2 cm laceration.  4 interrupted sutures placed on the other lacerations. Approximation:    Approximation:  Close Post-procedure details:    Dressing:  Sterile dressing   Patient tolerance of procedure:  Tolerated well, no immediate complications           Benjiman Core, MD 04/05/20 [phone]

## 2020-04-05 NOTE — H&P (Signed)
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 control   Mearl Latin, PA-C [phone] (C) 04/05/2020, 1:58 PM  [facility] [address] [place] [address] [phone] [place]406 [phone] (F)    After 5pm and on the weekends please log on to Amion, go to orthopaedics and the look under the [facility] Call for the provider(s) on call. You can also call our office at [phone] and then follow the prompts to be connected to the call team.
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
## 2020-04-05 NOTE — ED Notes (Addendum)
Pt placed on hospital bed for comfort and brought Mom a recliner to sleep in.   No other concerns at this time.
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
## 2020-04-05 NOTE — Anesthesia Procedure Notes (Signed)
Procedure Name: Intubation Date/Time: 04/05/2020 4:46 PM Performed by: Teressa Lower., CRNA Pre-anesthesia Checklist: Patient identified, Emergency Drugs available, Suction available and Patient being monitored Patient Re-evaluated:Patient Re-evaluated prior to induction Oxygen Delivery Method: Circle system utilized Preoxygenation: Pre-oxygenation with 100% oxygen Induction Type: IV induction Ventilation: Mask ventilation without difficulty Laryngoscope Size: Mac and 3 Grade View: Grade I Tube type: Oral Tube size: 6.5 mm Number of attempts: 1 Airway Equipment and Method: Stylet Placement Confirmation: ETT inserted through vocal cords under direct vision,  positive ETCO2 and breath sounds checked- equal and bilateral Secured at: 22 cm Tube secured with: Tape Dental Injury: Teeth and Oropharynx as per pre-operative assessment

## 2020-04-05 NOTE — Plan of Care (Signed)
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
## 2020-04-05 NOTE — ED Notes (Signed)
Ordered hospital bed--Leslie 

## 2020-04-06 ENCOUNTER — Encounter (HOSPITAL_COMMUNITY): Payer: Self-pay | Admitting: Orthopedic Surgery

## 2020-04-06 ENCOUNTER — Other Ambulatory Visit (HOSPITAL_COMMUNITY): Payer: Self-pay | Admitting: Orthopedic Surgery

## 2020-04-06 DIAGNOSIS — S0181XA Laceration without foreign body of other part of head, initial encounter: Secondary | ICD-10-CM

## 2020-04-06 LAB — CBC
HCT: 32 % — ABNORMAL LOW (ref 36.0–46.0)
Hemoglobin: 10.2 g/dL — ABNORMAL LOW (ref 12.0–15.0)
MCH: 27.6 pg (ref 26.0–34.0)
MCHC: 31.9 g/dL (ref 30.0–36.0)
MCV: 86.7 fL (ref 80.0–100.0)
Platelets: 211 10*3/uL (ref 150–400)
RBC: 3.69 MIL/uL — ABNORMAL LOW (ref 3.87–5.11)
RDW: 12.4 % (ref 11.5–15.5)
WBC: 8 10*3/uL (ref 4.0–10.5)
nRBC: 0 % (ref 0.0–0.2)

## 2020-04-06 MED ORDER — HYDROCODONE-ACETAMINOPHEN 5-325 MG PO TABS: 1.0000 | ORAL_TABLET | Freq: Three times a day (TID) | ORAL | 0 refills | Status: DC | PRN

## 2020-04-06 MED ORDER — ACETAMINOPHEN 500 MG PO TABS: 500.0000 mg | ORAL_TABLET | Freq: Three times a day (TID) | ORAL | 1 refills | Status: DC

## 2020-04-06 MED ORDER — APIXABAN 2.5 MG PO TABS: 2.5000 mg | ORAL_TABLET | Freq: Two times a day (BID) | ORAL | 0 refills | Status: DC

## 2020-04-06 MED ORDER — METHOCARBAMOL 500 MG PO TABS: 500.0000 mg | ORAL_TABLET | Freq: Three times a day (TID) | ORAL | 1 refills | Status: DC | PRN

## 2020-04-06 MED ORDER — DOCUSATE SODIUM 100 MG PO CAPS: 100.0000 mg | ORAL_CAPSULE | Freq: Two times a day (BID) | ORAL | 0 refills | Status: DC

## 2020-04-06 MED FILL — ELIQUIS 2.5 MG TABLET: 2.5 | 30 days supply | Qty: 60 | Fill #0

## 2020-04-06 MED FILL — ACETAMINOPHEN 500MG XT STRE: 500 | 20 days supply | Qty: 60 | Fill #0

## 2020-04-06 MED FILL — HYDROCODON-APAP 5-325: 5-325 | 7 days supply | Qty: 40 | Fill #0

## 2020-04-06 MED FILL — METHOCARBAMOL 500 MG TABS: 500 | 10 days supply | Qty: 60 | Fill #0

## 2020-04-06 MED FILL — DOCUSATE SODIUM 100 MG CAPS: 100 | 10 days supply | Qty: 20 | Fill #0

## 2020-04-06 NOTE — TOC CAGE-AID Note (Signed)
Transition of Care [facility]) - CAGE-AID Screening   Patient Details  Name: Linda Key MRN: [id number] Date of Birth: [birth year]  [facility]) CM/SW Contact:    Jimmy Picket, LCSWA Phone Number: 04/06/2020, 4:04 PM   Clinical Narrative:  CSW spoke to pt via phone. CSW introduced self and explained her role at the hospital.  PT denies alcohol use and substance use. Pt did not need any resources at this time.    CAGE-AID Screening:    Have You Ever Felt You Ought to Cut Down on Your Drinking or Drug Use?: No Have People Annoyed You By Critizing Your Drinking Or Drug Use?: No Have You Felt Bad Or Guilty About Your Drinking Or Drug Use?: No Have You Ever Had a Drink or Used Drugs First Thing In The Morning to Steady Your Nerves or to Get Rid of a Hangover?: No CAGE-AID Score: 0  Substance Abuse Education Offered: Yes     Denita Lung, Bridget Hartshorn Clinical Social Worker [phone]

## 2020-04-06 NOTE — Progress Notes (Signed)
Pt given discharge instructions and gone over with her. She verbalized understanding. Medications delivered from [facility] pharmacy. All belongings gathered to be sent home. Pt's mom to transport home.
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
## 2020-04-06 NOTE — Plan of Care (Signed)
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
## 2020-04-07 LAB — NASOPHARYNGEAL CULTURE: Culture: NORMAL
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
## 2021-05-13 IMAGING — RF DG C-ARM 1-60 MIN
1 series · 13 of 13 positions shown · non-contrast
Comparison: CT yesterday.

CLINICAL DATA: Sacroiliac pinning.

EXAM:
DG C-ARM 1-60 MIN; JUDET PELVIS - 3+ VIEW
FLUOROSCOPY TIME:  Fluoroscopy Time:  1 minutes 25 seconds
Radiation Exposure Index (if provided by the fluoroscopic device):
39.91 mGy
Number of Acquired Spot Images: 13

[Series 1: run · 13 of 13 slices shown]
[im 1/13]
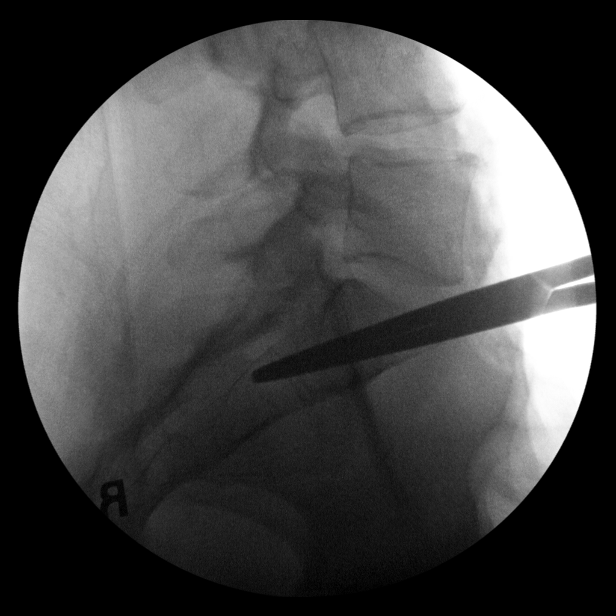
[im 2/13]
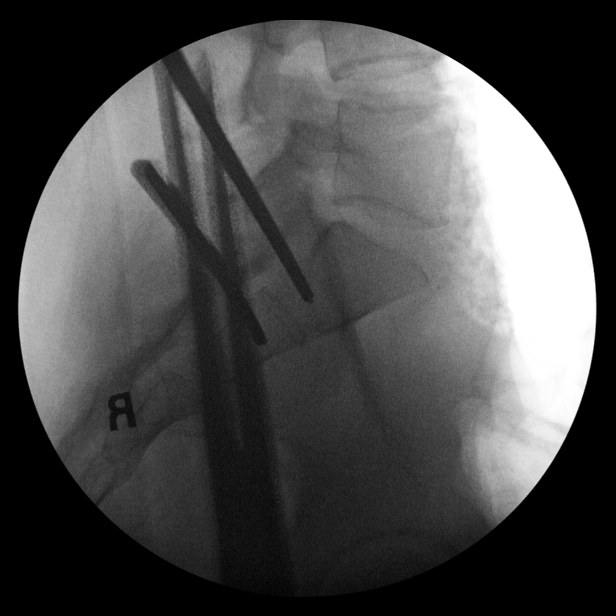
[im 3/13]
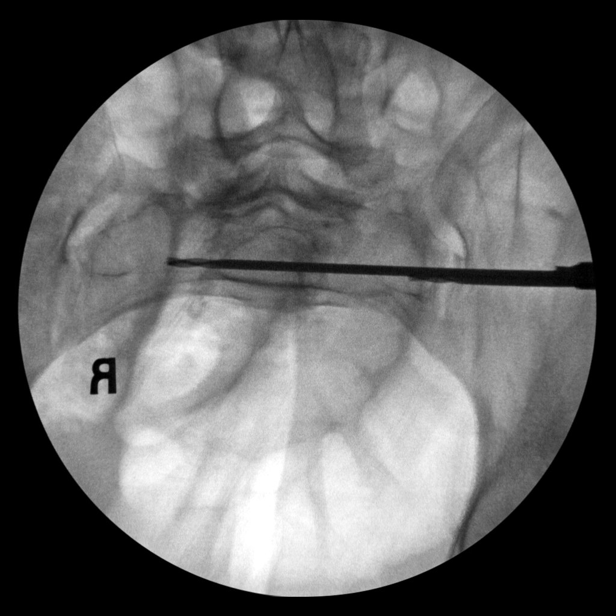
[im 4/13]
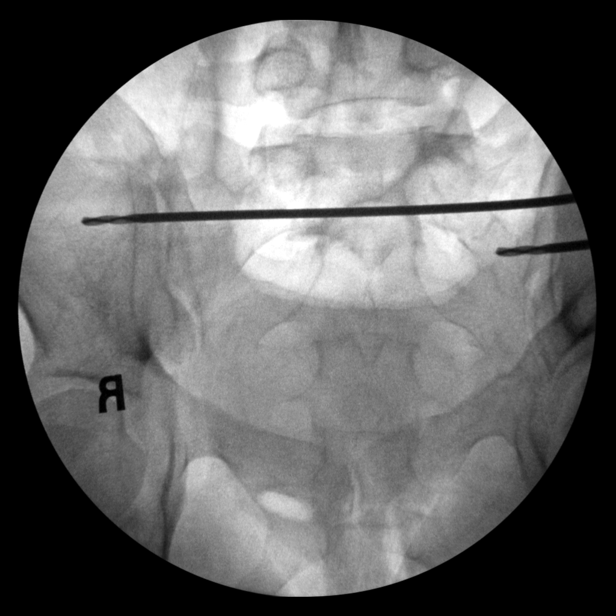
[im 5/13]
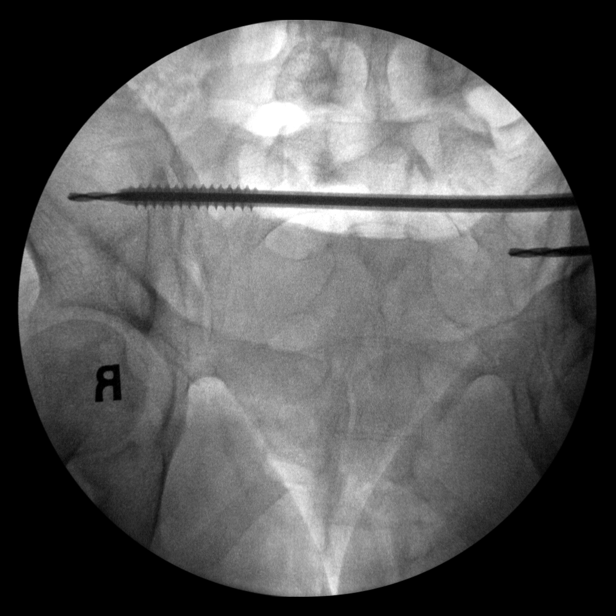
[im 6/13]
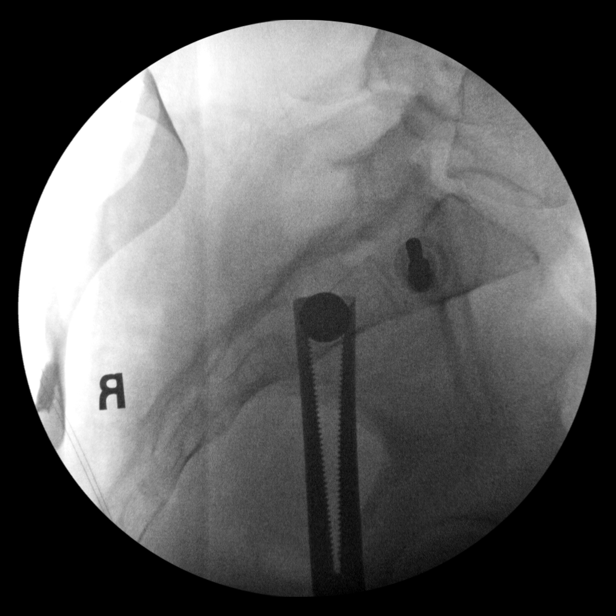
[im 7/13]
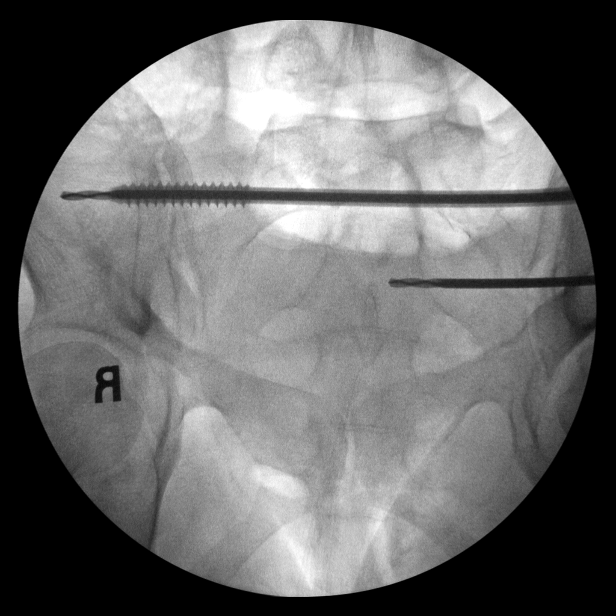
[im 8/13]
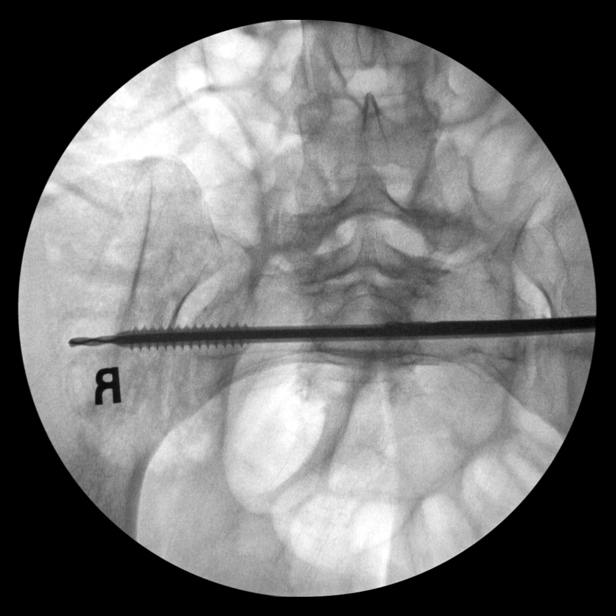
[im 9/13]
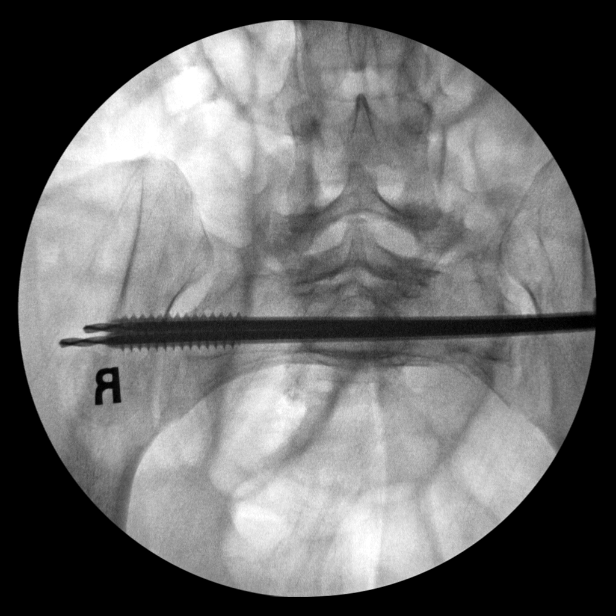
[im 10/13]
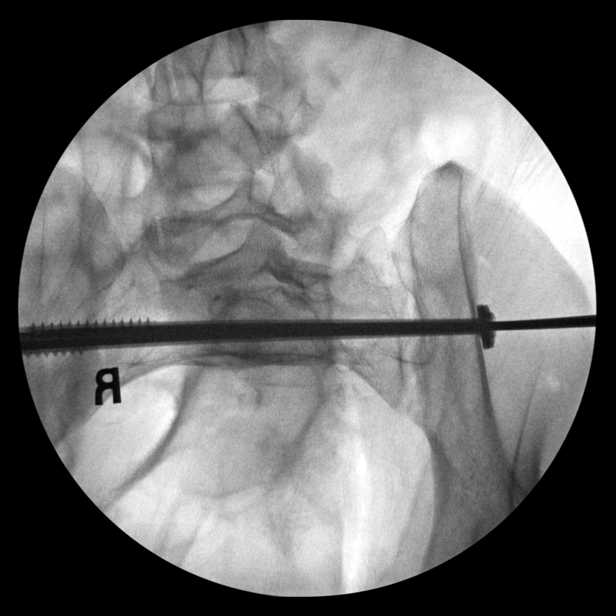
[im 11/13]
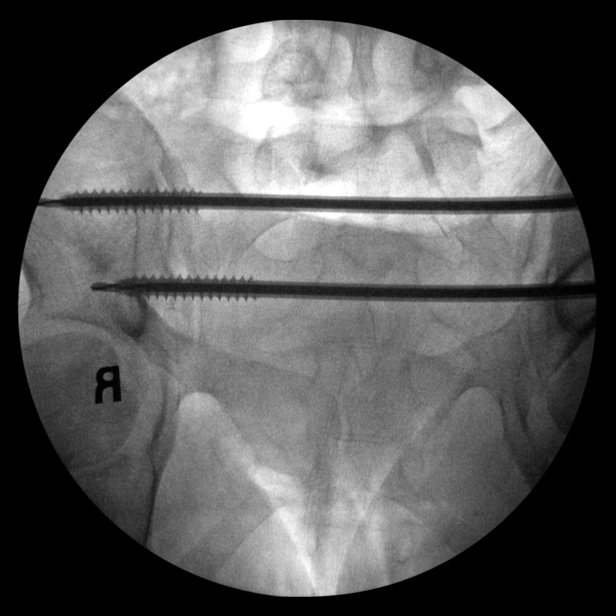
[im 12/13]
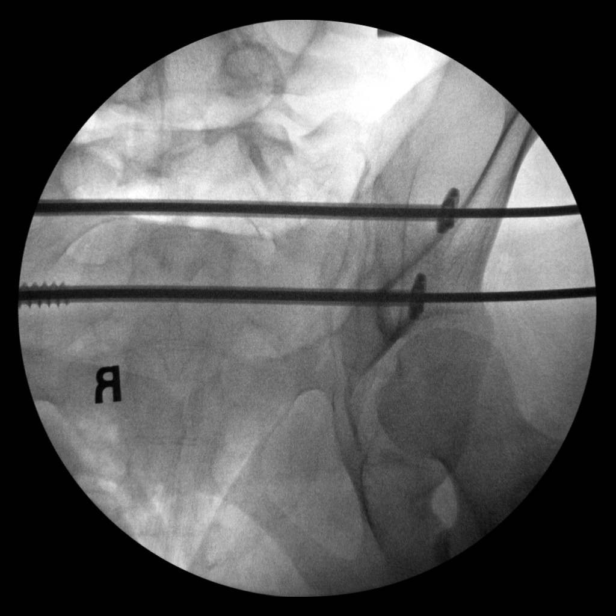
[im 13/13]
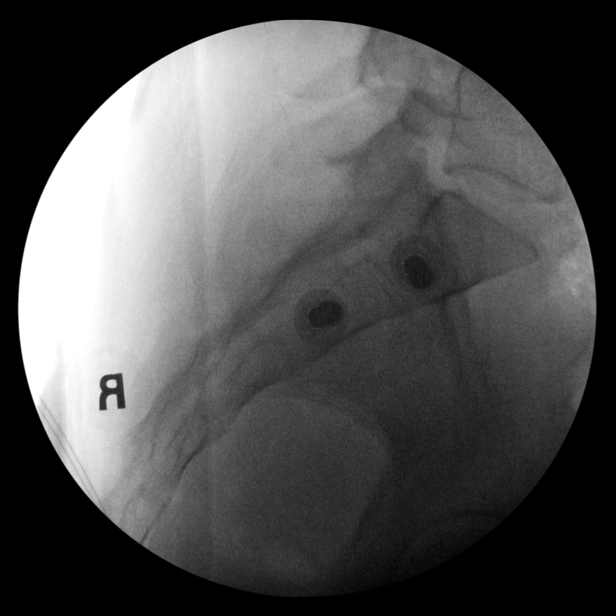

[13 of 13 positions shown; findings below may reference images not displayed]

FINDINGS: Thirteen fluoroscopic spot images obtained in the operating room.
Placement of 2 screws traversing the sacroiliac joints. Left sacral
fracture better appreciated on prior CT.
IMPRESSION: Procedural fluoroscopy during sacroiliac pinning.
